# Patient Record
Sex: Female | Born: 1950 | Race: White | Hispanic: No | Marital: Married | Smoking: Never smoker
Health system: Southern US, Community
[De-identification: ages and names within clinical notes are randomized; demographics above are authoritative.]

## PROBLEM LIST (undated history)

## (undated) DIAGNOSIS — I10 Essential (primary) hypertension: Secondary | ICD-10-CM

---

## 2022-02-02 ENCOUNTER — Emergency Department (HOSPITAL_COMMUNITY): Payer: Medicare PPO

## 2022-02-02 ENCOUNTER — Encounter (HOSPITAL_BASED_OUTPATIENT_CLINIC_OR_DEPARTMENT_OTHER): Payer: Self-pay | Admitting: Emergency Medicine

## 2022-02-02 ENCOUNTER — Emergency Department (HOSPITAL_BASED_OUTPATIENT_CLINIC_OR_DEPARTMENT_OTHER)
Admission: EM | Admit: 2022-02-02 | Discharge: 2022-02-02 | Disposition: A | Discharge status: Home / Self Care | Payer: Medicare PPO | Attending: Emergency Medicine | Admitting: Emergency Medicine

## 2022-02-02 ENCOUNTER — Emergency Department (HOSPITAL_BASED_OUTPATIENT_CLINIC_OR_DEPARTMENT_OTHER): Payer: Medicare PPO

## 2022-02-02 DIAGNOSIS — R202 Paresthesia of skin: Secondary | ICD-10-CM | POA: Diagnosis not present

## 2022-02-02 DIAGNOSIS — R531 Weakness: Secondary | ICD-10-CM | POA: Diagnosis not present

## 2022-02-02 DIAGNOSIS — H538 Other visual disturbances: Secondary | ICD-10-CM | POA: Diagnosis not present

## 2022-02-02 DIAGNOSIS — E871 Hypo-osmolality and hyponatremia: Secondary | ICD-10-CM | POA: Insufficient documentation

## 2022-02-02 HISTORY — DX: Essential (primary) hypertension: I10

## 2022-02-02 LAB — BASIC METABOLIC PANEL
Anion gap: 10 (ref 5–15)
BUN: 13 mg/dL (ref 8–23)
CO2: 24 mmol/L (ref 22–32)
Calcium: 9.6 mg/dL (ref 8.9–10.3)
Chloride: 97 mmol/L — ABNORMAL LOW (ref 98–111)
Creatinine, Ser: 0.7 mg/dL (ref 0.44–1.00)
GFR, Estimated: >60 mL/min (ref >60)
Glucose, Bld: 127 mg/dL — ABNORMAL HIGH (ref 70–99)
Potassium: 3.6 mmol/L (ref 3.5–5.1)
Sodium: 131 mmol/L — ABNORMAL LOW (ref 135–145)

## 2022-02-02 LAB — CBC
HCT: 38.6 % (ref 36.0–46.0)
Hemoglobin: 13.7 g/dL (ref 12.0–15.0)
MCH: 31.1 pg (ref 26.0–34.0)
MCHC: 35.5 g/dL (ref 30.0–36.0)
MCV: 87.5 fL (ref 80.0–100.0)
Platelets: 206 10*3/uL (ref 150–400)
RBC: 4.41 MIL/uL (ref 3.87–5.11)
RDW: 12.6 % (ref 11.5–15.5)
WBC: 6.8 10*3/uL (ref 4.0–10.5)
nRBC: 0 % (ref 0.0–0.2)

## 2022-02-02 LAB — TROPONIN I (HIGH SENSITIVITY): Troponin I (High Sensitivity): 2 ng/L (ref <18)

## 2022-02-02 NOTE — ED Provider Notes (Signed)
Patient was transferred here from [facility] for MRI of the brain.  I assumed care upon arrival here.  Briefly the patient has been complaining of paresthesias to the left hand and left foot.  They have been getting progressively worse.  Worse today and so she had come to the ER.  Had seen her family doctor recently with plans to get an MRI of the brain.  MRI is resulted and is negative for acute intracranial pathology.  I discussed the results with the patient.  We will have her follow-up with her family doctor. ? ? ?  ?Melene Plan, DO ?02/02/22 1856 ? ?

## 2022-02-02 NOTE — ED Triage Notes (Signed)
Left arm tingling and left foot tingling x 2 weeks, worsening today ?Lightheadedness as well  ?Denies Chest pain but states indigestion ?A&O x 4 , NAD at this time ?

## 2022-02-02 NOTE — Progress Notes (Signed)
ON CALL PHONE CONSULT ? ?Call from [email or web address]. [email or web address] ? ? ?Discussion ?Patient with 2 weeks worth of left-sided tingling and numbness which is worse today.  Given focality of symptoms, should get an MRI of the brain without contrast.  If negative, can have outpatient work-up.  Is being evaluated by outpatient doctor for paresthesias and dizziness in [place]. ?Recommended ED to ED transfer for MRI brain without contrast ?Please call inpatient neurology at [facility] if MRI is positive for stroke. ? ? ?-- ?[facility], MD ?Neurologist ?[facility] ?Pager: [phone] ? ? ?

## 2022-02-02 NOTE — ED Notes (Signed)
Pt. In no distress.

## 2022-02-02 NOTE — ED Provider Notes (Signed)
?[facility] EMERGENCY DEPARTMENT ?Provider Note ? ? ?CSN: [id number] ?Arrival date & time: 02/02/22  1334 ? ?  ? ?History ? ?Chief Complaint  ?Patient presents with  ? Tingling  ? ? ?Lindsey Alexander is a 71 y.o. female.  She has no significant past medical history.  She is complaining of 2 weeks of slowly progressive numbness and tingling in her left arm and left leg.  She says her left arm also feels slightly weaker.  She saw her primary care doctor in [place] and it was recommended she get an MRI but she has not had that yet.  She is also felt more dizzy lightheaded but this has been going on for a while.  She thinks that the vision in her right eye might be slightly worse recently 2.  No headache or neck pain no difficulty with balance. ? ?The history is provided by the patient.  ?Cerebrovascular Accident ?This is a new problem. Episode onset: 2 weeks. The problem occurs constantly. The problem has been gradually worsening. Pertinent negatives include no chest pain, no abdominal pain, no headaches and no shortness of breath. Nothing aggravates the symptoms. Nothing relieves the symptoms. She has tried nothing for the symptoms. The treatment provided no relief.  ? ?  ? ?Home Medications ?Prior to Admission medications   ?Not on File  ?   ? ?Allergies    ?Patient has no known allergies.   ? ?Review of Systems   ?Review of Systems  ?Constitutional:  Negative for fever.  ?HENT:  Negative for sore throat.   ?Eyes:  Positive for visual disturbance.  ?Respiratory:  Negative for shortness of breath.   ?Cardiovascular:  Negative for chest pain.  ?Gastrointestinal:  Negative for abdominal pain.  ?Genitourinary:  Negative for dysuria.  ?Musculoskeletal:  Negative for neck pain.  ?Skin:  Negative for rash.  ?Neurological:  Positive for weakness and numbness. Negative for speech difficulty and headaches.  ? ?Physical Exam ?Updated Vital Signs ?BP (!) 153/86 (BP Location: Right Arm)   Pulse 77   Temp 99 ?F (37.2  ?C) (Oral)   Resp 18   Ht 5\' 2"  (1.575 m)   Wt 47.6 kg   SpO2 98%   BMI 19.20 kg/m?  ?Physical Exam ?Vitals and nursing note reviewed.  ?Constitutional:   ?   General: She is not in acute distress. ?   Appearance: Normal appearance. She is well-developed.  ?HENT:  ?   Head: Normocephalic and atraumatic.  ?Eyes:  ?   Conjunctiva/sclera: Conjunctivae normal.  ?Cardiovascular:  ?   Rate and Rhythm: Normal rate and regular rhythm.  ?   Heart sounds: No murmur heard. ?Pulmonary:  ?   Effort: Pulmonary effort is normal. No respiratory distress.  ?   Breath sounds: Normal breath sounds.  ?Abdominal:  ?   Palpations: Abdomen is soft.  ?   Tenderness: There is no abdominal tenderness.  ?Musculoskeletal:     ?   General: No swelling. Normal range of motion.  ?   Cervical back: Neck supple.  ?   Right lower leg: No edema.  ?   Left lower leg: No edema.  ?Skin: ?   General: Skin is warm and dry.  ?   Capillary Refill: Capillary refill takes less than 2 seconds.  ?Neurological:  ?   General: No focal deficit present.  ?   Mental Status: She is alert.  ?   Cranial Nerves: No cranial nerve deficit.  ?  Sensory: Sensory deficit present.  ?   Motor: No weakness.  ?   Coordination: Coordination normal.  ?   Gait: Gait normal.  ?   Comments: Patient has subjective decrease sensation in her left hand and left foot.  Cranial nerves intact.  I do not appreciate any weakness 5 out of 5 upper and lower extremities.  She has normal proprioception and normal finger-to-nose.  She says it feels slightly more difficult to do the finger-nose with her left arm  ? ? ?ED Results / Procedures / Treatments   ?Labs ?(all labs ordered are listed, but only abnormal results are displayed) ?Labs Reviewed  ?BASIC METABOLIC PANEL - Abnormal; Notable for the following components:  ?    Result Value  ? Sodium 131 (*)   ? Chloride 97 (*)   ? Glucose, Bld 127 (*)   ? All other components within normal limits  ?CBC  ?TROPONIN I (HIGH SENSITIVITY)   ?TROPONIN I (HIGH SENSITIVITY)  ? ? ?EKG ?EKG Interpretation ? ?Date/Time:  Tuesday Feb 02 2022 13:50:23 EDT ?Ventricular Rate:  77 ?PR Interval:  134 ?QRS Duration: 100 ?QT Interval:  379 ?QTC Calculation: 429 ?R Axis:   47 ?Text Interpretation: Sinus rhythm RSR' in V1 or V2, right VCD or RVH No old tracing to compare Confirmed by Meridee ScoreButler, Keiyon Plack [phone]([id number]) on 02/02/2022 2:11:30 PM ? ?Radiology ?DG Chest 2 View ? ?Result Date: 02/02/2022 ?CLINICAL DATA:  Chest pressure.  Left arm tingling and numbness. EXAM: CHEST - 2 VIEW COMPARISON:  None Available. FINDINGS: The heart size and mediastinal contours are within normal limits. Both lungs are clear. The visualized skeletal structures are unremarkable. IMPRESSION: No active cardiopulmonary disease. Electronically Signed   By: Obie DredgeWilliam T Derry M.D.   On: 02/02/2022 14:16   ? ?Procedures ?Procedures  ? ? ?Medications Ordered in ED ?Medications - No data to display ? ?ED Course/ Medical Decision Making/ A&P ?Clinical Course as of 02/02/22 1501  ?Tue Feb 02, 2022  ?1425 Chest x-ray ordered interpreted by me as no acute findings.  Awaiting radiology reading. [MB]  ?1432 Reviewed with Dr. Wilford CornerArora neurology.  He is recommending that the patient be transferred and get an MRI brain.  If it is positive will need further neurologic work-up.  He did not feel she needs a CT angio at this time. [MB]  ?  ?Clinical Course User Index ?[MB] Terrilee FilesButler, Antasia Haider C, MD  ? ?                        ?Medical Decision Making ?Amount and/or Complexity of Data Reviewed ?Labs: ordered. ?Radiology: ordered. ? ?This patient complains of tingling left arm left leg possibly some left arm weakness; this involves an extensive number of treatment ?Options and is a complaint that carries with it a high risk of complications and ?morbidity. The differential includes stroke, TIA, metabolic derangement, anemia, infection ? ?I ordered, reviewed and interpreted labs, which included CBC with normal white count normal  hemoglobin, chemistries with mildly low sodium elevated glucose, troponin unremarkable ? ?I ordered imaging studies which included pain.  She will to [facility] for this modality  ?additional history obtained from patient's husband ?Previous records obtained and reviewed in epic no recent admissions ?I consulted neurology Dr. Wilford CornerArora And discussed lab and imaging findings and discussed disposition.  ?Cardiac monitoring reviewed, normal sinus rhythm ?Social determinants considered, no significant barriers ?Critical Interventions: None ? ?After the interventions stated above, I reevaluated  the patient and found patient still to be symptomatic.  ?Admission and further testing considered, she is agreeable to transfer to [facility] for getting an MRI.  She wishes to be transferred by private vehicle and husband is comfortable with taking her there.  If her MRI is positive she will need formal neurologic consult and likely admission.  If MRI negative likely can follow-up outpatient with her PCP back in [place] ? ? ? ? ? ? ? ? ? ?Final Clinical Impression(s) / ED Diagnoses ?Final diagnoses:  ?Numbness and tingling of left arm and leg  ?Hyponatremia  ? ? ?Rx / DC Orders ?ED Discharge Orders   ? ? None  ? ?  ? ? ?  ?Terrilee Files, MD ?02/02/22 1700 ? ?

## 2022-02-02 NOTE — Discharge Instructions (Signed)
Please follow up with your family doctor in the office.  They may want to refer you to a neurologist. ?

## 2022-02-02 NOTE — ED Notes (Signed)
Pt. Reports she has had weakness in the L hand and tingling in the L foot for weeks.  Pt. Does reports sensation loss in the L foot when touch between L and R foot.  Pt. Reports her R eye has more visual issues.  Pt. Has no facial deformities and has equal smile and speech is normal.  ?

## 2023-04-14 IMAGING — MR MR HEAD W/O CM
6 of 11 series · 24 of 48 positions shown · non-contrast
Comparison: None Available.

CLINICAL DATA: Numbness and tingling on left side of body for 2
weeks, stroke suspected

EXAM:
MRI HEAD WITHOUT CONTRAST
TECHNIQUE: Multiplanar, multiecho pulse sequences of the brain and surrounding
structures were obtained without intravenous contrast.

[Series 3: DWI · axial · 3.0mm · 0.94mm/px · z∈[-187,-51]mm · 7 of 100 slices shown (1 of 2)]
[im 1/100]
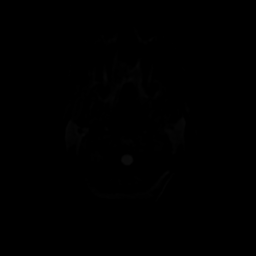
[im 17/100]
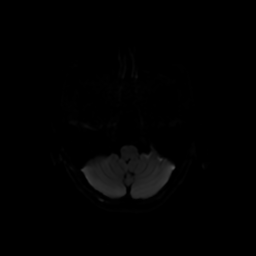
[im 34/100]
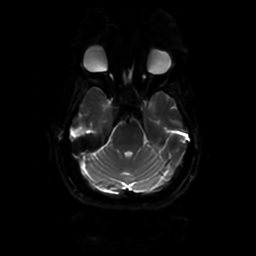
[im 50/100]
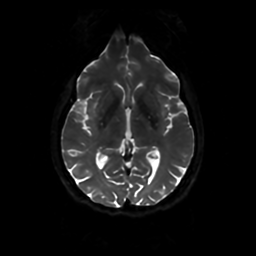
[im 67/100]
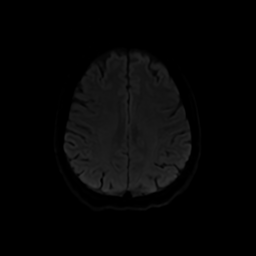
[im 83/100]
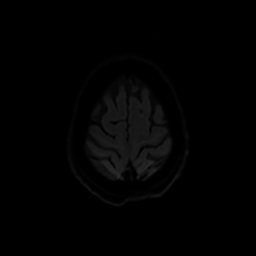
[im 100/100]
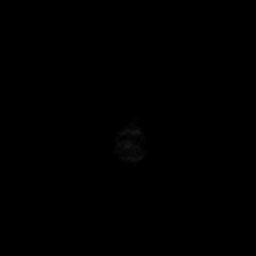

[Series 4: DWI · coronal · 4.0mm · 0.94mm/px · 5 of 68 slices shown (2 of 2)]
[im 1/68]
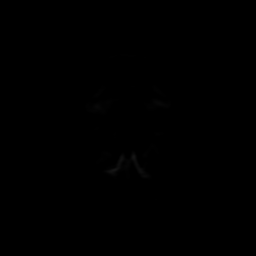
[im 17/68]
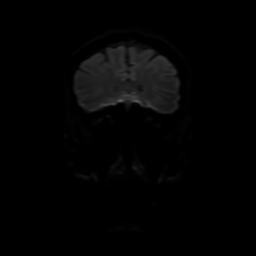
[im 34/68]
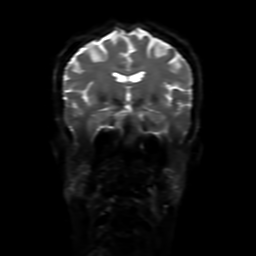
[im 51/68]
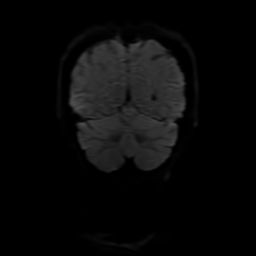
[im 68/68]
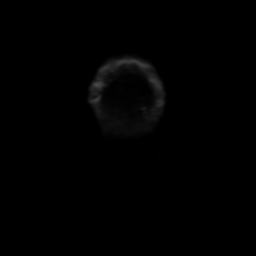

[Series 5: FLAIR · sagittal · 5.0mm · 0.23mm/px · 2 of 23 slices shown (1 of 2)]
[im 1/23]
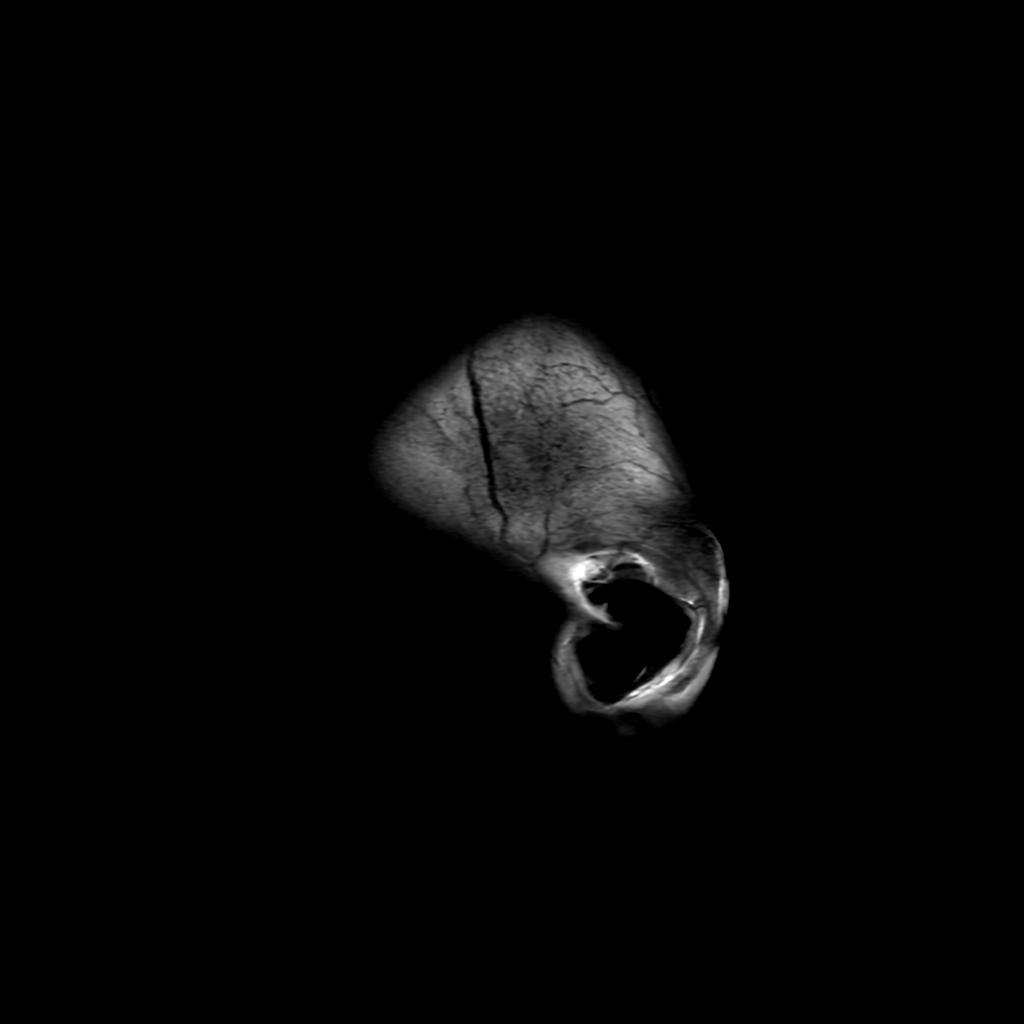
[im 23/23]
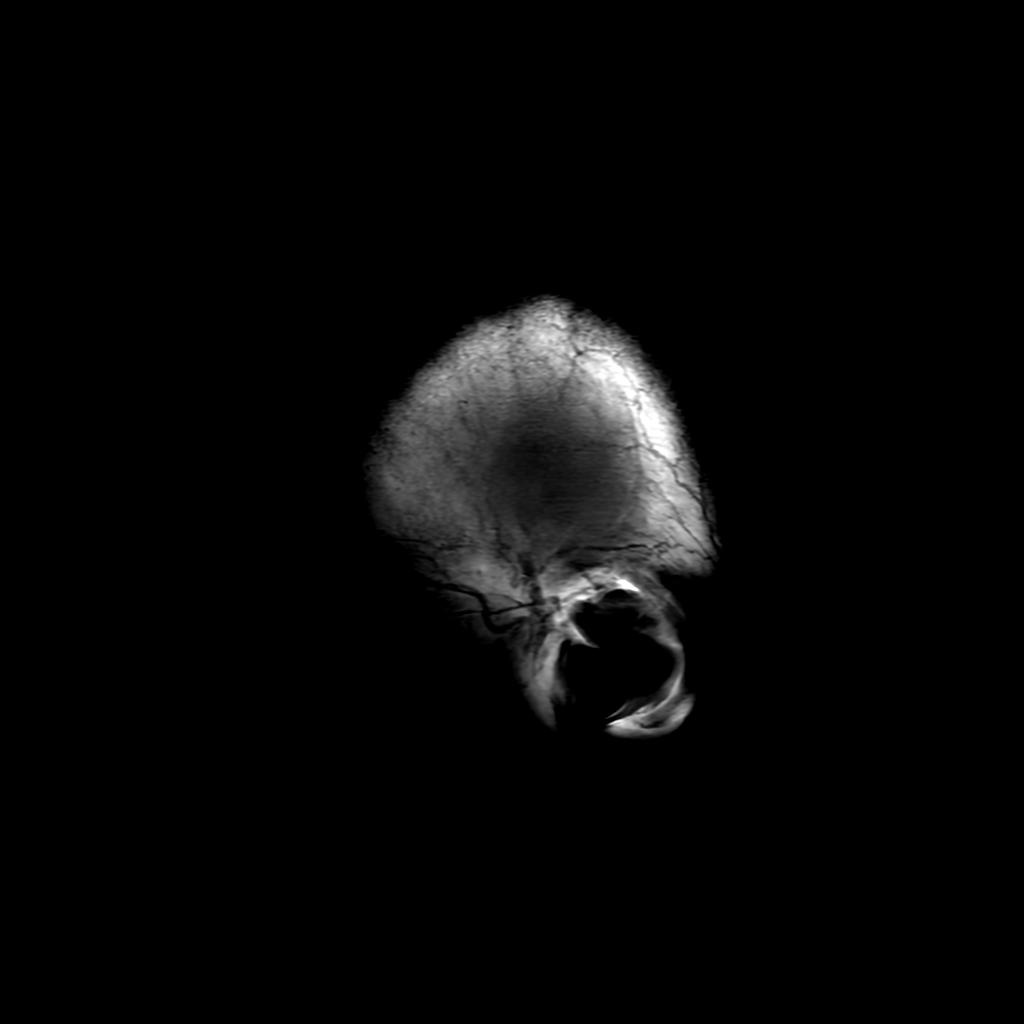

[Series 7: FLAIR · axial · 4.0mm · 0.45mm/px · z∈[-186,-48]mm · 3 of 35 slices shown (2 of 2)]
[im 1/35]
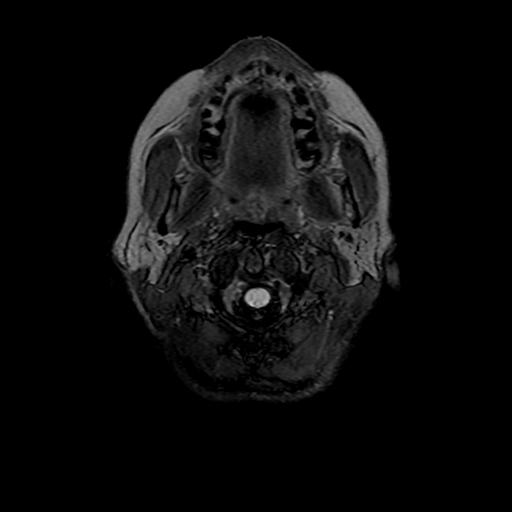
[im 18/35]
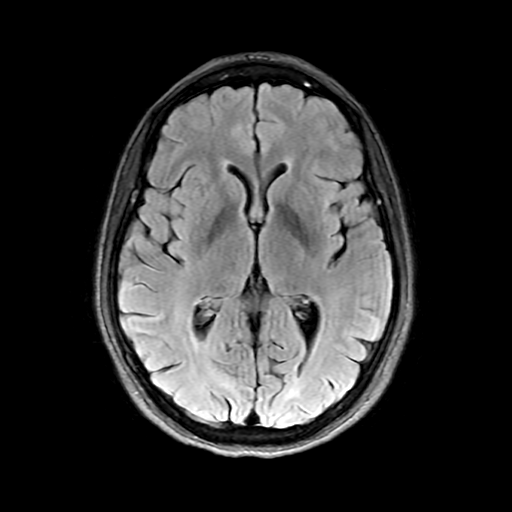
[im 35/35]
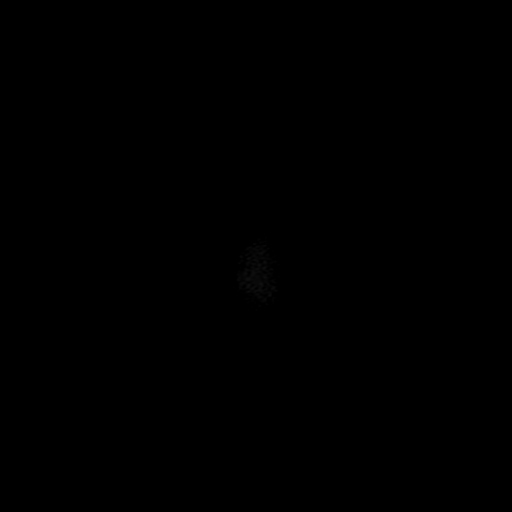

[Series 350: ADC · axial · 3.0mm · 0.94mm/px · z∈[-187,-51]mm · 4 of 50 slices shown (1 of 2)]
[im 1/50]
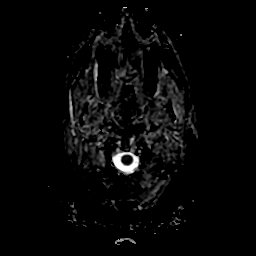
[im 17/50]
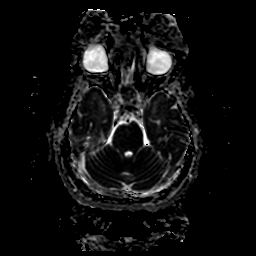
[im 33/50]
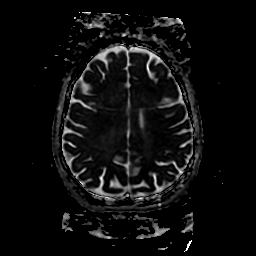
[im 50/50]
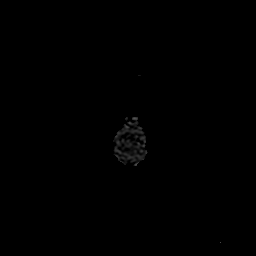

[Series 450: ADC · coronal · 4.0mm · 0.94mm/px · 3 of 34 slices shown (2 of 2)]
[im 1/34]
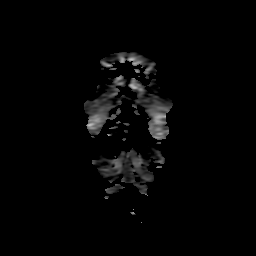
[im 17/34]
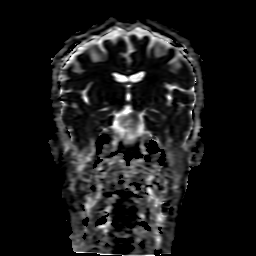
[im 34/34]
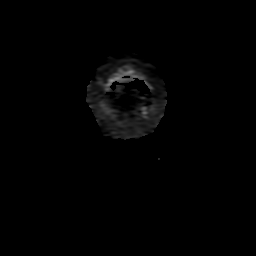

[24 of 48 positions shown; findings below may reference images not displayed]

FINDINGS: Brain: There is no acute intracranial hemorrhage, extra-axial fluid
collection, or acute infarct.

Parenchymal volume is normal. The ventricles are normal in size.
Gray-white differentiation is preserved. Parenchymal signal is
normal, with no significant burden of white matter microangiopathic
change.

There is no mass lesion.  There is no mass effect or midline shift.

Vascular: Normal flow voids.

Skull and upper cervical spine: Normal marrow signal.

Sinuses/Orbits: The paranasal sinuses are clear. The globes and
orbits are unremarkable.

Other: There is trace fluid in the left mastoid tip. The imaged
nasopharynx is unremarkable.
IMPRESSION: Unremarkable brain MRI with no acute intracranial pathology.
